# Patient Record
Sex: Male | Born: 1990 | Race: White | Hispanic: No | Marital: Single | Smoking: Never smoker
Health system: Southern US, Community
[De-identification: ages and names within clinical notes are randomized; demographics above are authoritative.]

## PROBLEM LIST (undated history)

## (undated) DIAGNOSIS — S42009A Fracture of unspecified part of unspecified clavicle, initial encounter for closed fracture: Secondary | ICD-10-CM

## (undated) DIAGNOSIS — J45909 Unspecified asthma, uncomplicated: Secondary | ICD-10-CM

## (undated) DIAGNOSIS — K219 Gastro-esophageal reflux disease without esophagitis: Secondary | ICD-10-CM

---

## 2013-01-03 ENCOUNTER — Encounter (HOSPITAL_COMMUNITY): Payer: Self-pay | Admitting: Emergency Medicine

## 2013-01-03 ENCOUNTER — Emergency Department (HOSPITAL_COMMUNITY)
Admission: EM | Admit: 2013-01-03 | Discharge: 2013-01-03 | Disposition: A | Discharge status: Home / Self Care | Payer: BC Managed Care – PPO | Attending: Emergency Medicine | Admitting: Emergency Medicine

## 2013-01-03 ENCOUNTER — Emergency Department (HOSPITAL_COMMUNITY): Payer: BC Managed Care – PPO

## 2013-01-03 DIAGNOSIS — J45901 Unspecified asthma with (acute) exacerbation: Secondary | ICD-10-CM | POA: Insufficient documentation

## 2013-01-03 DIAGNOSIS — Z79899 Other long term (current) drug therapy: Secondary | ICD-10-CM | POA: Insufficient documentation

## 2013-01-03 DIAGNOSIS — R110 Nausea: Secondary | ICD-10-CM | POA: Insufficient documentation

## 2013-01-03 DIAGNOSIS — K219 Gastro-esophageal reflux disease without esophagitis: Secondary | ICD-10-CM | POA: Insufficient documentation

## 2013-01-03 DIAGNOSIS — R0789 Other chest pain: Secondary | ICD-10-CM

## 2013-01-03 DIAGNOSIS — Z8781 Personal history of (healed) traumatic fracture: Secondary | ICD-10-CM | POA: Insufficient documentation

## 2013-01-03 HISTORY — DX: Unspecified asthma, uncomplicated: J45.909

## 2013-01-03 HISTORY — DX: Fracture of unspecified part of unspecified clavicle, initial encounter for closed fracture: S42.009A

## 2013-01-03 HISTORY — DX: Gastro-esophageal reflux disease without esophagitis: K21.9

## 2013-01-03 LAB — POCT I-STAT TROPONIN I: Troponin i, poc: 0 ng/mL (ref 0.00–0.08)

## 2013-01-03 MED ORDER — GI COCKTAIL ~~LOC~~
30.0000 mL | Freq: Once | ORAL | Status: AC
  Administered 2013-01-03: 30 mL via ORAL
  Filled 2013-01-03: qty 30

## 2013-01-03 MED ORDER — OMEPRAZOLE 20 MG PO CPDR: 20.0000 mg | DELAYED_RELEASE_CAPSULE | Freq: Every day | ORAL | Status: AC

## 2013-01-03 NOTE — ED Notes (Signed)
Pt laughing with friends at bedside; states pain is better; gi cocktail tolerated without difficulty

## 2013-01-03 NOTE — ED Provider Notes (Signed)
CSN: [id number]     Arrival date & time 01/03/13  0103 History   First MD Initiated Contact with Patient 01/03/13 0341     Chief Complaint  Patient presents with  . Chest Pain   (Consider location/radiation/quality/duration/timing/severity/associated sxs/prior Treatment) HPI Comments: Patient is a 22 y/o male with a hx of GERD, on pepcid daily, who presents today for central, sharp, nonradiating chest pain x 6 days. Symptoms are intermittent and have been associated with nausea and mild shortness of breath, though patient denies those symptoms at present. He has taken his Pepcid daily as well as Rolaids and Tums sporadically without relief of symptoms. Symptoms aggravated after eating and mildly alleviated with drinking fluids. Denies associated fever, emesis, cough, hemoptysis, hematemesis, abdominal pain, numbness/tingling, weakness, and syncope.  The history is provided by the patient. No language interpreter was used.    Past Medical History  Diagnosis Date  . GERD (gastroesophageal reflux disease)   . Asthma   . Clavicle fracture    History reviewed. No pertinent past surgical history. History reviewed. No pertinent family history. History  Substance Use Topics  . Smoking status: Never Smoker   . Smokeless tobacco: Never Used  . Alcohol Use: Yes     Comment: occ.    Review of Systems  Respiratory: Positive for shortness of breath.   Cardiovascular: Positive for chest pain.  Gastrointestinal: Positive for nausea.  All other systems reviewed and are negative.    Allergies  Sulfa antibiotics  Home Medications   Current Outpatient Rx  Name  Route  Sig  Dispense  Refill  . calcium carbonate (TUMS - DOSED IN MG ELEMENTAL CALCIUM) 500 MG chewable tablet   Oral   Chew 1 tablet by mouth daily as needed for indigestion or heartburn.         . famotidine (PEPCID) 20 MG tablet   Oral   Take 20 mg by mouth 2 (two) times daily.         Marland Kitchen ibuprofen (ADVIL,MOTRIN) 200 MG  tablet   Oral   Take 200 mg by mouth every 6 (six) hours as needed for mild pain.         Marland Kitchen omeprazole (PRILOSEC) 20 MG capsule   Oral   Take 1 capsule (20 mg total) by mouth daily.   30 capsule   0    BP 132/80  Pulse 75  Resp 16  SpO2 98% Physical Exam  Nursing note and vitals reviewed. Constitutional: He is oriented to person, place, and time. He appears well-developed and well-nourished. No distress.  HENT:  Head: Normocephalic and atraumatic.  Mouth/Throat: Oropharynx is clear and moist. No oropharyngeal exudate.  Eyes: Conjunctivae and EOM are normal. Pupils are equal, round, and reactive to light. No scleral icterus.  Neck: Normal range of motion.  Cardiovascular: Normal rate, regular rhythm and normal heart sounds.   Pulmonary/Chest: Effort normal. No respiratory distress. He has no wheezes. He has no rales.  Abdominal: Soft. He exhibits no distension. There is no rebound.  Musculoskeletal: Normal range of motion.  Neurological: He is alert and oriented to person, place, and time.  Skin: Skin is warm and dry. No rash noted. He is not diaphoretic. No erythema. No pallor.  Psychiatric: He has a normal mood and affect. His behavior is normal.    ED Course  Procedures (including critical care time) Labs Review Labs Reviewed  POCT I-STAT TROPONIN I   Imaging Review Dg Chest 2 View  01/03/2013   CLINICAL  DATA:  Chest pain and shortness of breath, history of asthma.  EXAM: CHEST  2 VIEW  COMPARISON:  None available for comparison at time of study interpretation.  FINDINGS: Cardiomediastinal silhouette is unremarkable. The lungs are clear without pleural effusions or focal consolidations. Pulmonary vasculature is unremarkable. Trachea projects midline and there is no pneumothorax. Soft tissue planes and included osseous structures are nonsuspicious.  Multiple EKG lines overlie the patient and may obscure subtle underlying pathology.  IMPRESSION: No active cardiopulmonary  disease.   Electronically Signed   By: Awilda Metro   On: 01/03/2013 02:24    EKG Interpretation    Date/Time:  Tuesday January 03 2013 01:10:39 EST Ventricular Rate:  77 PR Interval:  135 QRS Duration: 89 QT Interval:  362 QTC Calculation: 410 R Axis:   66 Text Interpretation:  Sinus rhythm Normal ECG No old tracing to compare Confirmed by KNAPP  MD-I, IVA ([id number]) on 01/03/2013 1:58:15 AM            MDM   1. Atypical chest pain    Atypical chest pain most c/w worsening reflux in this 22 y/o male. Symptoms exacerbated by eating. Patient well and nontoxic appearing, hemodynamically stable, and afebrile. Unremarkable physical exam and negative work up; troponin negative and EKG as well as CXR without any acute changes. Doubt ACS in this patient given hx of GERD, lack of cardiac risk factors, age, and reassuring work up today. Will d/c with PCP follow up and Rx for Prilosec daily. Return precautions discussed and patient agreeable to plan.    Antony Madura, PA-C 01/05/13 1720

## 2013-01-03 NOTE — ED Notes (Signed)
Pt reports he has had centralized CP since 11/27, constant, ShOB initially but not currently, nauseated earlier but not currently, reports hx of GERD but has not had any difficulties with this in the past 10 years without medication. States no known changes to diet or lifestyle since then. States eating makes pain worse, drinking makes pain better Tums and Pepcid taken at home without relief.

## 2013-01-10 NOTE — ED Provider Notes (Signed)
Medical screening examination/treatment/procedure(s) were performed by non-physician practitioner and as supervising physician I was immediately available for consultation/collaboration.  EKG Interpretation    Date/Time:  Tuesday January 03 2013 01:10:39 EST Ventricular Rate:  77 PR Interval:  135 QRS Duration: 89 QT Interval:  362 QTC Calculation: 410 R Axis:   66 Text Interpretation:  Sinus rhythm Normal ECG No old tracing to compare Confirmed by Rosalie Gelpi  MD-I, Westyn Driggers ([id number]) on 01/03/2013 1:58:15 AM            Devoria Albe, MD, Franz Dell, MD 01/10/13 1712

## 2014-04-28 IMAGING — CR DG CHEST 2V
2 series · 2 of 2 positions shown · non-contrast
Comparison: None available for comparison at time of study
interpretation.

CLINICAL DATA: Chest pain and shortness of breath, history of
asthma.

EXAM:
CHEST  2 VIEW

[w chest pa]
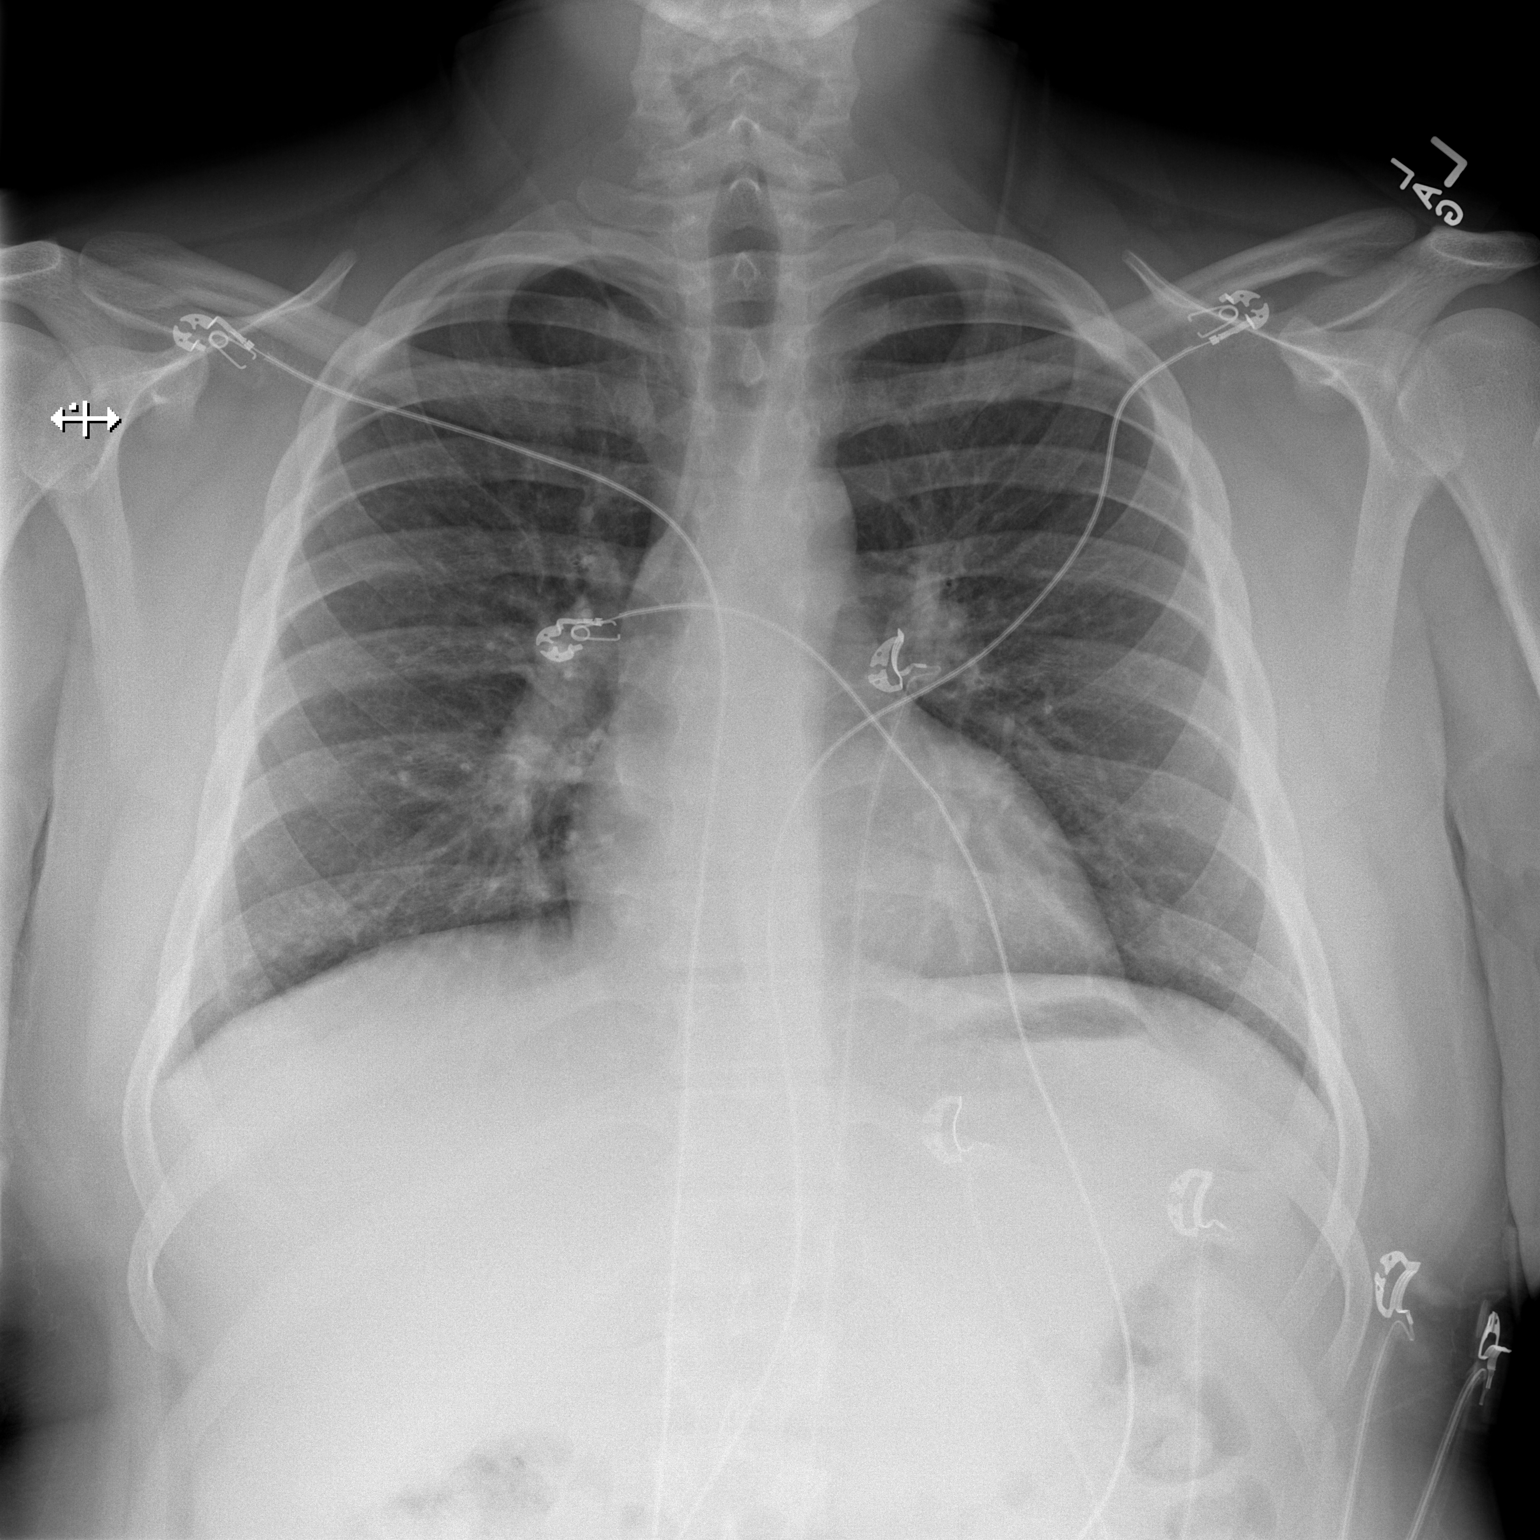

[w chest lat]
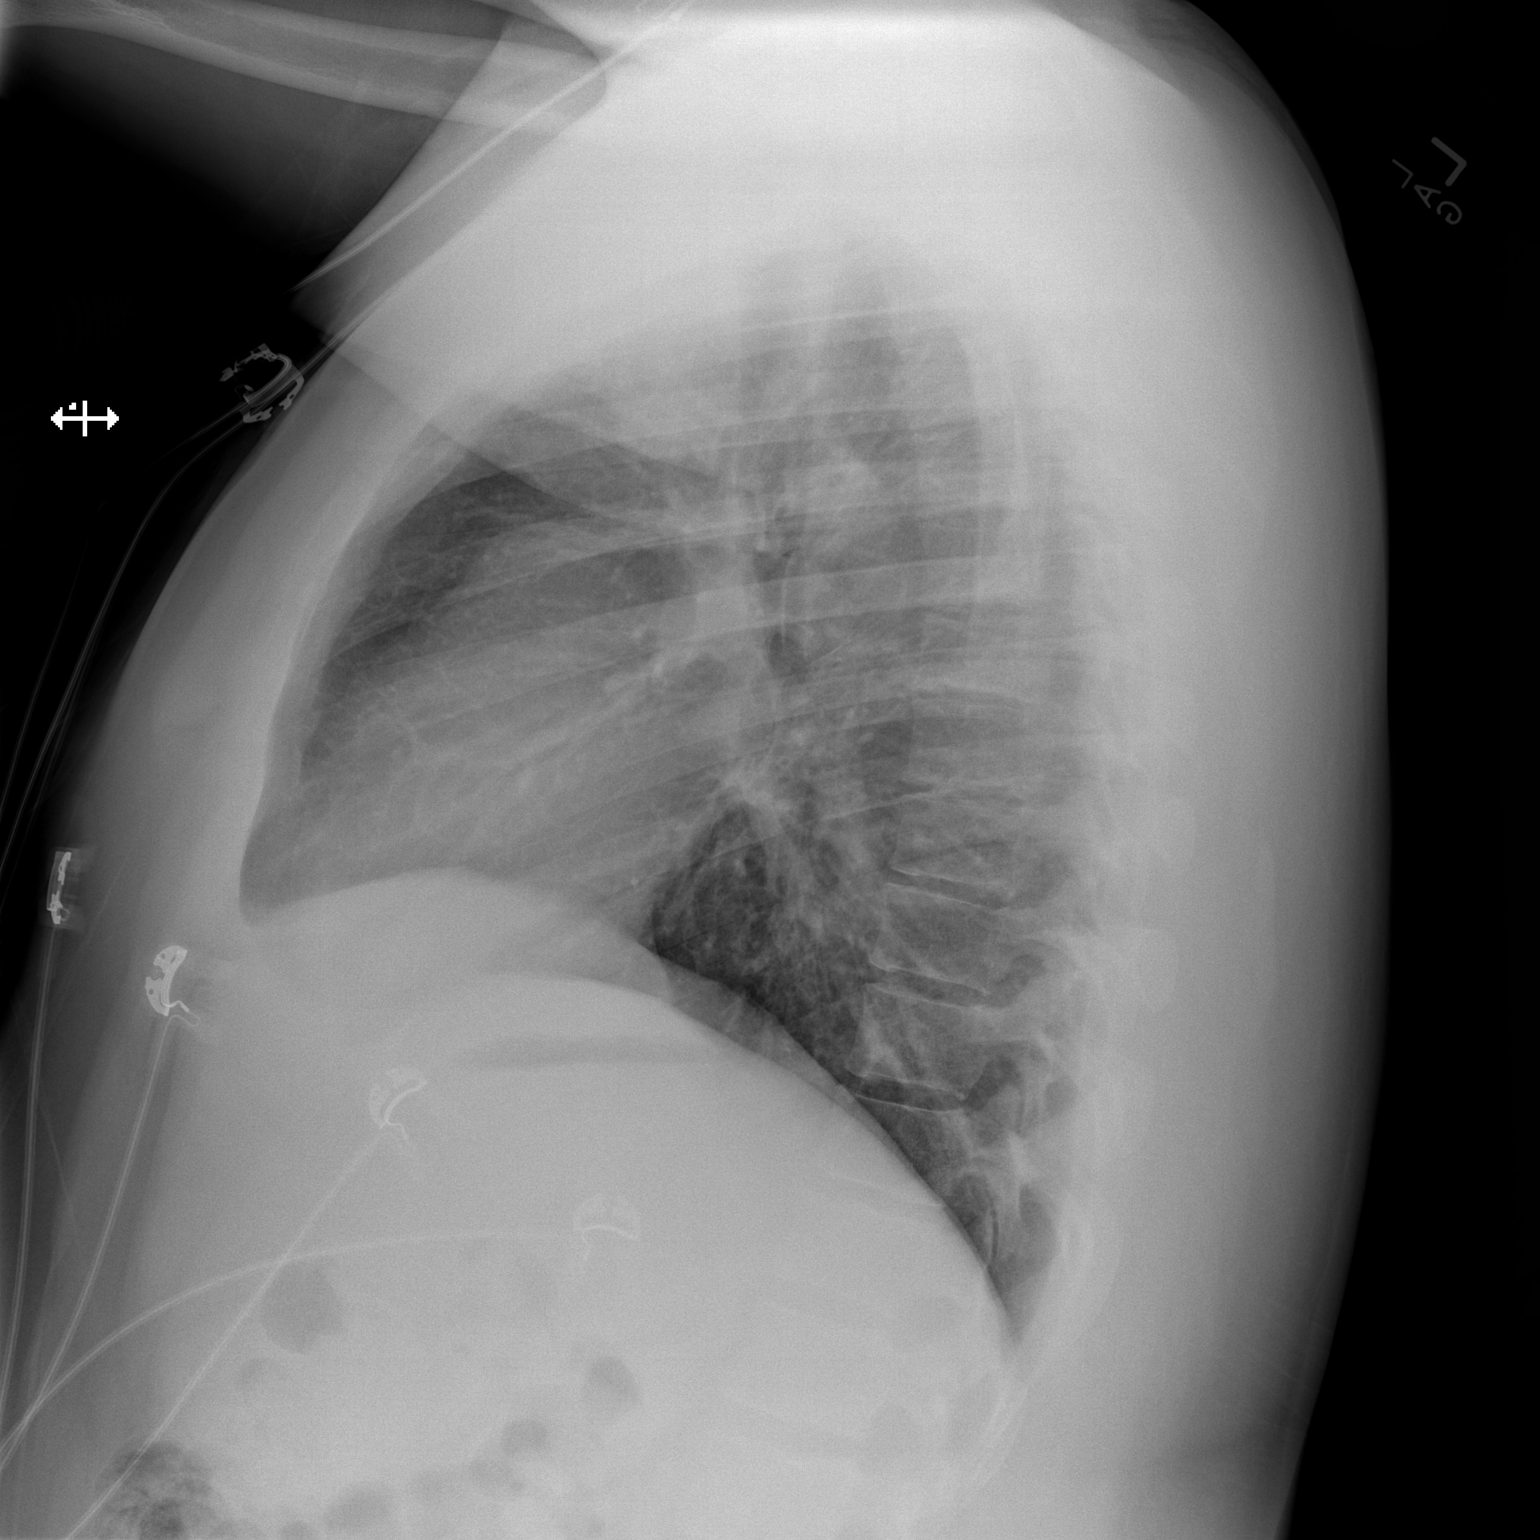

[2 of 2 positions shown; findings below may reference images not displayed]

FINDINGS: Cardiomediastinal silhouette is unremarkable. The lungs are clear
without pleural effusions or focal consolidations. Pulmonary
vasculature is unremarkable. Trachea projects midline and there is
no pneumothorax. Soft tissue planes and included osseous structures
are nonsuspicious.

Multiple EKG lines overlie the patient and may obscure subtle
underlying pathology.
IMPRESSION: No active cardiopulmonary disease.

  By: Jem William

## 2015-02-24 ENCOUNTER — Emergency Department (HOSPITAL_BASED_OUTPATIENT_CLINIC_OR_DEPARTMENT_OTHER)
Admission: EM | Admit: 2015-02-24 | Discharge: 2015-02-24 | Disposition: A | Discharge status: Home / Self Care | Payer: BLUE CROSS/BLUE SHIELD | Attending: Emergency Medicine | Admitting: Emergency Medicine

## 2015-02-24 ENCOUNTER — Encounter (HOSPITAL_BASED_OUTPATIENT_CLINIC_OR_DEPARTMENT_OTHER): Payer: Self-pay | Admitting: Emergency Medicine

## 2015-02-24 DIAGNOSIS — Z8781 Personal history of (healed) traumatic fracture: Secondary | ICD-10-CM | POA: Insufficient documentation

## 2015-02-24 DIAGNOSIS — Z79899 Other long term (current) drug therapy: Secondary | ICD-10-CM | POA: Diagnosis not present

## 2015-02-24 DIAGNOSIS — J45909 Unspecified asthma, uncomplicated: Secondary | ICD-10-CM | POA: Insufficient documentation

## 2015-02-24 DIAGNOSIS — M7989 Other specified soft tissue disorders: Secondary | ICD-10-CM | POA: Diagnosis present

## 2015-02-24 DIAGNOSIS — Z9889 Other specified postprocedural states: Secondary | ICD-10-CM | POA: Insufficient documentation

## 2015-02-24 DIAGNOSIS — IMO0001 Reserved for inherently not codable concepts without codable children: Secondary | ICD-10-CM

## 2015-02-24 DIAGNOSIS — K219 Gastro-esophageal reflux disease without esophagitis: Secondary | ICD-10-CM | POA: Diagnosis not present

## 2015-02-24 DIAGNOSIS — L03011 Cellulitis of right finger: Secondary | ICD-10-CM | POA: Insufficient documentation

## 2015-02-24 MED ORDER — LIDOCAINE HCL (PF) 1 % IJ SOLN
5.0000 mL | Freq: Once | INTRAMUSCULAR | Status: AC
  Administered 2015-02-24: 5 mL via INTRADERMAL
  Filled 2015-02-24: qty 5

## 2015-02-24 MED ORDER — CLINDAMYCIN HCL 150 MG PO CAPS: 450.0000 mg | ORAL_CAPSULE | Freq: Three times a day (TID) | ORAL | Status: AC

## 2015-02-24 NOTE — ED Notes (Signed)
Patient has red area to his right ring finger.

## 2015-02-24 NOTE — ED Provider Notes (Signed)
CSN: [id number]     Arrival date & time 02/24/15  2151 History   First MD Initiated Contact with Patient 02/24/15 2158     Chief Complaint  Patient presents with  . Finger Injury     (Consider location/radiation/quality/duration/timing/severity/associated sxs/prior Treatment) The history is provided by the patient and medical records. No language interpreter was used.    Ronald Rowland. is a 25 y.o. male  with a hx of GERD, asthma presents to the Emergency Department complaining of gradual, persistent, progressively worsening swelling of the right ring finger onset 2 days ago. Patient reports that he does bite his nails. He reports that site has been draining pus and he has been "picking at it." Patient reports no previous history of infections or recurrent infections. He denies HIV, steroid usage or diabetes. Nothing seems to make it better or worse.  Past Medical History  Diagnosis Date  . GERD (gastroesophageal reflux disease)   . Asthma   . Clavicle fracture    History reviewed. No pertinent past surgical history. History reviewed. No pertinent family history. Social History  Substance Use Topics  . Smoking status: Never Smoker   . Smokeless tobacco: Never Used  . Alcohol Use: Yes     Comment: occ.    Review of Systems  Constitutional: Negative for fever and chills.  Gastrointestinal: Negative for nausea and vomiting.  Endocrine: Negative for polydipsia, polyphagia and polyuria.  Skin: Positive for color change and wound.  Allergic/Immunologic: Negative for immunocompromised state.  Hematological: Does not bruise/bleed easily.  Psychiatric/Behavioral: The patient is not nervous/anxious.       Allergies  Sulfa antibiotics  Home Medications   Prior to Admission medications   Medication Sig Start Date End Date Taking? Authorizing Provider  calcium carbonate (TUMS - DOSED IN MG ELEMENTAL CALCIUM) 500 MG chewable tablet Chew 1 tablet by mouth daily as needed for  indigestion or heartburn.    Historical Provider, MD  clindamycin (CLEOCIN) 150 MG capsule Take 3 capsules (450 mg total) by mouth 3 (three) times daily. 02/24/15   Ailana Cuadrado, PA-C  famotidine (PEPCID) 20 MG tablet Take 20 mg by mouth 2 (two) times daily.    Historical Provider, MD  ibuprofen (ADVIL,MOTRIN) 200 MG tablet Take 200 mg by mouth every 6 (six) hours as needed for mild pain.    Historical Provider, MD  omeprazole (PRILOSEC) 20 MG capsule Take 1 capsule (20 mg total) by mouth daily. 01/03/13   Antony Madura, PA-C   BP 122/76 mmHg  Pulse 64  Temp(Src) 98.2 F (36.8 C) (Oral)  Resp 18  Ht  (1.702 m)  Wt 85.73 kg  BMI 29.59 kg/m2  SpO2 99% Physical Exam  Constitutional: He is oriented to person, place, and time. He appears well-developed and well-nourished. No distress.  HENT:  Head: Normocephalic and atraumatic.  Eyes: Conjunctivae are normal. No scleral icterus.  Neck: Normal range of motion.  Cardiovascular: Normal rate, regular rhythm, normal heart sounds and intact distal pulses.   No murmur heard. Pulmonary/Chest: Effort normal and breath sounds normal.  Abdominal: Soft. He exhibits no distension. There is no tenderness.  Lymphadenopathy:    He has no cervical adenopathy.  Neurological: He is alert and oriented to person, place, and time.  Skin: Skin is warm and dry. He is not diaphoretic. There is erythema.  Large area of induration and proximal ends of the nailbed of the right ring finger with tenderness to palpation, infection does not spread circumferentially  Psychiatric:  He has a normal mood and affect.  Nursing note and vitals reviewed.   ED Course  .Marland KitchenIncision and Drainage Date/Time: 02/24/2015 10:11 PM Performed by: Dierdre Forth Authorized by: Dierdre Forth Consent: Verbal consent obtained. Risks and benefits: risks, benefits and alternatives were discussed Consent given by: patient Patient understanding: patient states  understanding of the procedure being performed Patient consent: the patient's understanding of the procedure matches consent given Procedure consent: procedure consent matches procedure scheduled Relevant documents: relevant documents present and verified Site marked: the operative site was marked Required items: required blood products, implants, devices, and special equipment available Patient identity confirmed: arm band and verbally with patient Time out: Immediately prior to procedure a "time out" was called to verify the correct patient, procedure, equipment, support staff and site/side marked as required. Type: abscess Body area: upper extremity Location details: right ring finger Anesthesia: digital block Local anesthetic: lidocaine 1% without epinephrine Anesthetic total: 5 ml Patient sedated: no Scalpel size: 11 Incision type: single straight Drainage: purulent Drainage amount: moderate Wound treatment: wound left open Patient tolerance: Patient tolerated the procedure well with no immediate complications    MDM   Final diagnoses:  Paronychia of fourth finger of right hand   Ronald Rowland. presents with paronychia.  I&D without complication. Patient will be discharged home with clindamycin as he has sulfa allergy. No systemic symptoms.   Dahlia Client Jaiden Wahab, PA-C 02/24/15 2236  Loren Racer, MD 02/24/15 [phone]

## 2015-02-24 NOTE — ED Notes (Signed)
Pt did not want to wait to obtain vitals at d/c

## 2015-02-24 NOTE — Discharge Instructions (Signed)
1. Medications: clindamycin, usual home medications 2. Treatment: rest, drink plenty of fluids, daily soaks 3. Follow Up: Please followup with your primary doctor in 2-3 days for discussion of your diagnoses and further evaluation after today's visit; if you do not have a primary care doctor use the resource guide provided to find one; Please return to the ER for worsening symptoms   Paronychia Paronychia is an infection of the skin that surrounds a nail. It usually affects the skin around a fingernail, but it may also occur near a toenail. It often causes pain and swelling around the nail. This condition may come on suddenly or develop over a longer period. In some cases, a collection of pus (abscess) can form near or under the nail. Usually, paronychia is not serious and it clears up with treatment. CAUSES This condition may be caused by bacteria or fungi. It is commonly caused by either Streptococcus or Staphylococcus bacteria. The bacteria or fungi often cause the infection by getting into the affected area through an opening in the skin, such as a cut or a hangnail. RISK FACTORS This condition is more likely to develop in:  People who get their hands wet often, such as those who work as Fish farm manager, bartenders, or nurses.  People who bite their fingernails or suck their thumbs.  People who trim their nails too short.  People who have hangnails or injured fingertips.  People who get manicures.  People who have diabetes. SYMPTOMS Symptoms of this condition include:  Redness and swelling of the skin near the nail.  Tenderness around the nail when you touch the area.  Pus-filled bumps under the cuticle. The cuticle is the skin at the base or sides of the nail.  Fluid or pus under the nail.  Throbbing pain in the area. DIAGNOSIS This condition is usually diagnosed with a physical exam. In some cases, a sample of pus may be taken from an abscess to be tested in a lab. This can help  to determine what type of bacteria or fungi is causing the condition. TREATMENT Treatment for this condition depends on the cause and severity of the condition. If the condition is mild, it may clear up on its own in a few days. Your health care provider may recommend soaking the affected area in warm water a few times a day. When treatment is needed, the options may include:  Antibiotic medicine, if the condition is caused by a bacterial infection.  Antifungal medicine, if the condition is caused by a fungal infection.  Incision and drainage, if an abscess is present. In this procedure, the health care provider will cut open the abscess so the pus can drain out. HOME CARE INSTRUCTIONS  Soak the affected area in warm water if directed to do so by your health care provider. You may be told to do this for 20 minutes, 2-3 times a day. Keep the area dry in between soakings.  Take medicines only as directed by your health care provider.  If you were prescribed an antibiotic medicine, finish all of it even if you start to feel better.  Keep the affected area clean.  Do not try to drain a fluid-filled bump yourself.  If you will be washing dishes or performing other tasks that require your hands to get wet, wear rubber gloves. You should also wear gloves if your hands might come in contact with irritating substances, such as cleaners or chemicals.  Follow your health care provider's instructions about:  Wound  care.  Bandage (dressing) changes and removal. SEEK MEDICAL CARE IF:  Your symptoms get worse or do not improve with treatment.  You have a fever or chills.  You have redness spreading from the affected area.  You have continued or increased fluid, blood, or pus coming from the affected area.  Your finger or knuckle becomes swollen or is difficult to move.   This information is not intended to replace advice given to you by your health care provider. Make sure you discuss any  questions you have with your health care provider.   Document Released: 07/15/2000 Document Revised: 06/05/2014 Document Reviewed: 12/27/2013 [employer] Interactive Patient Education [employer]
